# Patient Record
Sex: Female | Born: 1995 | Race: White | Hispanic: No | Marital: Single | Smoking: Never smoker
Health system: Southern US, Community
[De-identification: ages and names within clinical notes are randomized; demographics above are authoritative.]

## PROBLEM LIST (undated history)

## (undated) DIAGNOSIS — G039 Meningitis, unspecified: Secondary | ICD-10-CM

## (undated) HISTORY — PX: WRIST SURGERY: SHX841

---
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
## 2022-07-11 ENCOUNTER — Emergency Department (HOSPITAL_COMMUNITY)
Admission: EM | Admit: 2022-07-11 | Discharge: 2022-07-11 | Disposition: A | Discharge status: Home / Self Care | Payer: Self-pay | Attending: Emergency Medicine | Admitting: Emergency Medicine

## 2022-07-11 ENCOUNTER — Other Ambulatory Visit: Payer: Self-pay

## 2022-07-11 ENCOUNTER — Encounter (HOSPITAL_COMMUNITY): Payer: Self-pay | Admitting: Emergency Medicine

## 2022-07-11 DIAGNOSIS — L02415 Cutaneous abscess of right lower limb: Secondary | ICD-10-CM | POA: Insufficient documentation

## 2022-07-11 MED ORDER — DOXYCYCLINE HYCLATE 100 MG PO CAPS: 100.0000 mg | ORAL_CAPSULE | Freq: Two times a day (BID) | ORAL | 0 refills | Status: DC

## 2022-07-11 NOTE — ED Provider Notes (Signed)
[name]   CSN: [id number] Arrival date & time: 07/11/22  1253     History  Chief Complaint  Patient presents with   Insect Bite    Mackenzie Strickland is a 26 y.o. female.  Patient presents to the emergency department today for evaluation of abscess and cellulitis to the inner right leg.  She denies trauma and thinks that she may have been bitten by an insect, but is not sure.  No fevers, nausea or vomiting.  She has a history of cellulitis in her legs.  She has been applying warm compresses and doing soaks at home.  She and her family member squeezed the abscess yesterday and drained dark-colored fluid from the area.       Home Medications Prior to Admission medications   Medication Sig Start Date End Date Taking? Authorizing Provider  doxycycline (VIBRAMYCIN) 100 MG capsule Take 1 capsule (100 mg total) by mouth 2 (two) times daily. 07/11/22  Yes Renne Crigler, PA-C      Allergies    Azithromycin    Review of Systems   Review of Systems  Physical Exam Updated Vital Signs BP 119/81 (BP Location: Right Arm)   Pulse 97   Temp 98.3 F (36.8 C) (Oral)   Resp 16   Ht 5' 7.5" (1.715 m)   Wt 115.2 kg   LMP 06/21/2022   SpO2 100%   BMI 39.19 kg/m  Physical Exam Vitals and nursing note reviewed.  Constitutional:      Appearance: She is well-developed.  HENT:     Head: Normocephalic and atraumatic.  Eyes:     Conjunctiva/sclera: Conjunctivae normal.  Pulmonary:     Effort: No respiratory distress.  Musculoskeletal:     Cervical back: Normal range of motion and neck supple.  Skin:    General: Skin is warm and dry.     Comments: There is approximately 2 cm diameter area of induration to the inner aspect of the right thigh.  There is several centimeters of surrounding cellulitis.  No lymphangitis.  The area is not particularly fluctuant today.  Mild tenderness.  Neurological:     Mental Status: She is alert.     ED Results /  Procedures / Treatments   Labs (all labs ordered are listed, but only abnormal results are displayed) Labs Reviewed - No data to display  EKG None  Radiology No results found.  Procedures Procedures    Medications Ordered in ED Medications - No data to display  ED Course/ Medical Decision Making/ A&P    Patient seen and examined. History obtained directly from patient.   Labs/EKG: None ordered  Imaging: None ordered  Medications/Fluids: None ordered  Most recent vital signs reviewed and are as follows: BP 119/81 (BP Location: Right Arm)   Pulse 97   Temp 98.3 F (36.8 C) (Oral)   Resp 16   Ht 5' 7.5" (1.715 m)   Wt 115.2 kg   LMP 06/21/2022   SpO2 100%   BMI 39.19 kg/m   Initial impression: Skin abscess and cellulitis.  We discussed incision and drainage.  Discussed risks and benefits of doing a procedure today versus monitoring at home.  Discussed need for follow-up if symptoms persist or worsen on antibiotics alone.  Patient would prefer not to have incision and drainage as she was able to express fluid from it yesterday.  Home treatment plan: Continue warm soaks compresses, p.o. doxycycline  Return instructions discussed with patient: Return  in 48 hours if not improving or sooner with worsening.                          Medical Decision Making Risk Prescription drug management.   Patient with uncomplicated cutaneous abscess with some associated cellulitis.  No fevers or signs of sepsis today.  No sign of skin breakdown or necrosis.  Area is indurated but not fluctuant.  Patient declines I&D today.        Final Clinical Impression(s) / ED Diagnoses Final diagnoses:  Cutaneous abscess of right lower extremity    Rx / DC Orders ED Discharge Orders          Ordered    doxycycline (VIBRAMYCIN) 100 MG capsule  2 times daily        07/11/22 1333              Renne Crigler, PA-C 07/11/22 1337    Gloris Manchester, MD 07/11/22 1806

## 2022-07-11 NOTE — ED Triage Notes (Signed)
Pt presents with insect bite to right inner thigh, possibly spider bite.

## 2022-07-11 NOTE — Discharge Instructions (Signed)
Please read and follow all provided instructions.  Your diagnoses today include:  1. Cutaneous abscess of right lower extremity     Tests performed today include: Vital signs. See below for your results today.   Medications prescribed:  Doxycycline - antibiotic  You have been prescribed an antibiotic medicine: take the entire course of medicine even if you are feeling better. Stopping early can cause the antibiotic not to work.  Take any prescribed medications only as directed.   Home care instructions:  Follow any educational materials contained in this packet  Follow-up instructions: Return to the Emergency Department in 48 hours for a recheck if your symptoms are not significantly improved.  Please follow-up with your primary care provider in the next 1 week for further evaluation of your symptoms.   Return instructions:  Return to the Emergency Department if you have: Fever Worsening symptoms Worsening pain Worsening swelling Redness of the skin that moves away from the affected area, especially if it streaks away from the affected area  Any other emergent concerns  Your vital signs today were: BP 119/81 (BP Location: Right Arm)   Pulse 97   Temp 98.3 F (36.8 C) (Oral)   Resp 16   Ht 5' 7.5" (1.715 m)   Wt 115.2 kg   LMP 06/21/2022   SpO2 100%   BMI 39.19 kg/m  If your blood pressure (BP) was elevated above 135/85 this visit, please have this repeated by your doctor within one month. --------------

## 2022-08-26 ENCOUNTER — Emergency Department (HOSPITAL_COMMUNITY): Payer: Self-pay

## 2022-08-26 ENCOUNTER — Encounter (HOSPITAL_COMMUNITY): Payer: Self-pay | Admitting: Emergency Medicine

## 2022-08-26 ENCOUNTER — Other Ambulatory Visit: Payer: Self-pay

## 2022-08-26 ENCOUNTER — Emergency Department (HOSPITAL_COMMUNITY)
Admission: EM | Admit: 2022-08-26 | Discharge: 2022-08-26 | Disposition: A | Discharge status: Home / Self Care | Payer: Self-pay | Attending: Student | Admitting: Student

## 2022-08-26 DIAGNOSIS — M549 Dorsalgia, unspecified: Secondary | ICD-10-CM | POA: Insufficient documentation

## 2022-08-26 DIAGNOSIS — E876 Hypokalemia: Secondary | ICD-10-CM | POA: Insufficient documentation

## 2022-08-26 DIAGNOSIS — R519 Headache, unspecified: Secondary | ICD-10-CM | POA: Insufficient documentation

## 2022-08-26 DIAGNOSIS — Z20822 Contact with and (suspected) exposure to covid-19: Secondary | ICD-10-CM | POA: Insufficient documentation

## 2022-08-26 DIAGNOSIS — R109 Unspecified abdominal pain: Secondary | ICD-10-CM | POA: Insufficient documentation

## 2022-08-26 DIAGNOSIS — M791 Myalgia, unspecified site: Secondary | ICD-10-CM | POA: Insufficient documentation

## 2022-08-26 HISTORY — DX: Meningitis, unspecified: G03.9

## 2022-08-26 LAB — URINALYSIS, ROUTINE W REFLEX MICROSCOPIC
Bilirubin Urine: NEGATIVE
Glucose, UA: NEGATIVE mg/dL
Ketones, ur: NEGATIVE mg/dL
Nitrite: NEGATIVE
Protein, ur: NEGATIVE mg/dL
RBC / HPF: >50 RBC/hpf — ABNORMAL HIGH (ref 0–5)
Specific Gravity, Urine: 1.005 (ref 1.005–1.030)
WBC, UA: >50 WBC/hpf — ABNORMAL HIGH (ref 0–5)
pH: 7 (ref 5.0–8.0)

## 2022-08-26 LAB — I-STAT BETA HCG BLOOD, ED (MC, WL, AP ONLY): I-stat hCG, quantitative: <5 m[IU]/mL (ref <5)

## 2022-08-26 LAB — CBC WITH DIFFERENTIAL/PLATELET
Abs Immature Granulocytes: 0.03 10*3/uL (ref 0.00–0.07)
Basophils Absolute: 0 10*3/uL (ref 0.0–0.1)
Basophils Relative: 0 %
Eosinophils Absolute: 0.1 10*3/uL (ref 0.0–0.5)
Eosinophils Relative: 1 %
HCT: 40.8 % (ref 36.0–46.0)
Hemoglobin: 13.5 g/dL (ref 12.0–15.0)
Immature Granulocytes: 0 %
Lymphocytes Relative: 19 %
Lymphs Abs: 2 10*3/uL (ref 0.7–4.0)
MCH: 27.8 pg (ref 26.0–34.0)
MCHC: 33.1 g/dL (ref 30.0–36.0)
MCV: 84 fL (ref 80.0–100.0)
Monocytes Absolute: 0.5 10*3/uL (ref 0.1–1.0)
Monocytes Relative: 5 %
Neutro Abs: 7.5 10*3/uL (ref 1.7–7.7)
Neutrophils Relative %: 75 %
Platelets: 305 10*3/uL (ref 150–400)
RBC: 4.86 MIL/uL (ref 3.87–5.11)
RDW: 13.4 % (ref 11.5–15.5)
WBC: 10.2 10*3/uL (ref 4.0–10.5)
nRBC: 0 % (ref 0.0–0.2)

## 2022-08-26 LAB — COMPREHENSIVE METABOLIC PANEL
ALT: 14 U/L (ref 0–44)
AST: 22 U/L (ref 15–41)
Albumin: 4.2 g/dL (ref 3.5–5.0)
Alkaline Phosphatase: 97 U/L (ref 38–126)
Anion gap: 4 — ABNORMAL LOW (ref 5–15)
BUN: 10 mg/dL (ref 6–20)
CO2: 22 mmol/L (ref 22–32)
Calcium: 9.2 mg/dL (ref 8.9–10.3)
Chloride: 112 mmol/L — ABNORMAL HIGH (ref 98–111)
Creatinine, Ser: 0.8 mg/dL (ref 0.44–1.00)
GFR, Estimated: >60 mL/min (ref >60)
Glucose, Bld: 96 mg/dL (ref 70–99)
Potassium: 3.3 mmol/L — ABNORMAL LOW (ref 3.5–5.1)
Sodium: 138 mmol/L (ref 135–145)
Total Bilirubin: 0.3 mg/dL (ref 0.3–1.2)
Total Protein: 8.2 g/dL — ABNORMAL HIGH (ref 6.5–8.1)

## 2022-08-26 LAB — RESP PANEL BY RT-PCR (FLU A&B, COVID) ARPGX2
Influenza A by PCR: NEGATIVE
Influenza B by PCR: NEGATIVE
SARS Coronavirus 2 by RT PCR: NEGATIVE

## 2022-08-26 MED ORDER — PROMETHAZINE HCL 12.5 MG PO TABS: 12.5000 mg | ORAL_TABLET | Freq: Three times a day (TID) | ORAL | 0 refills | Status: DC | PRN

## 2022-08-26 MED ORDER — LIDOCAINE 5 % EX PTCH
1.0000 | MEDICATED_PATCH | CUTANEOUS | Status: DC
  Administered 2022-08-26: 1 via TRANSDERMAL
  Filled 2022-08-26: qty 1

## 2022-08-26 MED ORDER — PROCHLORPERAZINE EDISYLATE 10 MG/2ML IJ SOLN
10.0000 mg | Freq: Once | INTRAMUSCULAR | Status: AC
  Administered 2022-08-26: 10 mg via INTRAVENOUS
  Filled 2022-08-26: qty 2

## 2022-08-26 MED ORDER — KETOROLAC TROMETHAMINE 15 MG/ML IJ SOLN
15.0000 mg | Freq: Once | INTRAMUSCULAR | Status: AC
  Administered 2022-08-26: 15 mg via INTRAVENOUS
  Filled 2022-08-26: qty 1

## 2022-08-26 MED ORDER — LACTATED RINGERS IV BOLUS
1000.0000 mL | Freq: Once | INTRAVENOUS | Status: AC
  Administered 2022-08-26: 1000 mL via INTRAVENOUS

## 2022-08-26 MED ORDER — PROMETHAZINE HCL 12.5 MG PO TABS: 12.5000 mg | ORAL_TABLET | Freq: Three times a day (TID) | ORAL | 0 refills | Status: AC | PRN

## 2022-08-26 MED ORDER — DIPHENHYDRAMINE HCL 50 MG/ML IJ SOLN
25.0000 mg | Freq: Once | INTRAMUSCULAR | Status: AC
  Administered 2022-08-26: 25 mg via INTRAVENOUS
  Filled 2022-08-26: qty 1

## 2022-08-26 NOTE — ED Triage Notes (Signed)
Pt states that she is here for a migraine. States she felt this way 5 yrs ago and it "ended up being meningitis".

## 2022-08-26 NOTE — Discharge Instructions (Addendum)
It was a pleasure caring for you today in the emergency department.  Please return to the emergency department for any worsening or worrisome symptoms.  Please follow up with your primary care doctor in the next couple days

## 2022-08-26 NOTE — ED Provider Notes (Addendum)
[facility] EMERGENCY DEPARTMENT Provider Note  CSN: [id number] Arrival date & time: 08/26/22 0351  Chief Complaint(s) Migraine  HPI Mackenzie Strickland is a 26 y.o. female with reported history of meningitis, migraines who presents emergency department for evaluation of a migraine headache.  Patient states that she has had a persistent headache for the last 4 days that has been gradually worsening.  She endorses associated back pain and myalgias but denies fever, neck stiffness, visual changes, numbness, tingling, weakness or other neurologic complaints.  Endorses nausea but no vomiting.  States she is currently on her menstrual cycle.   Past Medical History Past Medical History:  Diagnosis Date   Meningitis    There are no problems to display for this patient.  Home Medication(s) Prior to Admission medications   Medication Sig Start Date End Date Taking? Authorizing Provider  doxycycline (VIBRAMYCIN) 100 MG capsule Take 1 capsule (100 mg total) by mouth 2 (two) times daily. 07/11/22   Renne Crigler, PA-C                                                                                                                                    Past Surgical History Past Surgical History:  Procedure Laterality Date   WRIST SURGERY Left    Family History History reviewed. No pertinent family history.  Social History Social History   Tobacco Use   Smoking status: Never   Smokeless tobacco: Never  Vaping Use   Vaping Use: Never used  Substance Use Topics   Alcohol use: Yes    Comment: ocassionally   Drug use: Not Currently   Allergies Azithromycin  Review of Systems Review of Systems  Musculoskeletal:  Positive for myalgias.  Neurological:  Positive for headaches.    Physical Exam Vital Signs  I have reviewed the triage vital signs BP 120/64 (BP Location: Left Arm)   Pulse 81   Temp 98 F (36.7 C) (Oral)   Resp 20   Ht 5' 7.5" (1.715 m)   Wt 125.2 kg   LMP 08/22/2022  (Exact Date)   SpO2 100%   BMI 42.59 kg/m   Physical Exam Vitals and nursing note reviewed.  Constitutional:      General: She is not in acute distress.    Appearance: She is well-developed.  HENT:     Head: Normocephalic and atraumatic.  Eyes:     Conjunctiva/sclera: Conjunctivae normal.  Cardiovascular:     Rate and Rhythm: Normal rate and regular rhythm.     Heart sounds: No murmur heard. Pulmonary:     Effort: Pulmonary effort is normal. No respiratory distress.     Breath sounds: Normal breath sounds.  Abdominal:     Palpations: Abdomen is soft.     Tenderness: There is no abdominal tenderness.  Musculoskeletal:        General: No swelling.     Cervical back: Neck supple. No rigidity.  Skin:    General: Skin is warm and dry.     Capillary Refill: Capillary refill takes less than 2 seconds.  Neurological:     Mental Status: She is alert.     Cranial Nerves: No cranial nerve deficit.     Sensory: No sensory deficit.     Motor: No weakness.  Psychiatric:        Mood and Affect: Mood normal.     ED Results and Treatments Labs (all labs ordered are listed, but only abnormal results are displayed) Labs Reviewed  RESP PANEL BY RT-PCR (FLU A&B, COVID) ARPGX2  COMPREHENSIVE METABOLIC PANEL  CBC WITH DIFFERENTIAL/PLATELET  I-STAT BETA HCG BLOOD, ED ([facility], [facility], [facility] ONLY)                                                                                                                          Radiology No results found.  Pertinent labs & imaging results that were available during my care of the patient were reviewed by me and considered in my medical decision making (see MDM for details).  Medications Ordered in ED Medications  prochlorperazine (COMPAZINE) injection 10 mg (has no administration in time range)  diphenhydrAMINE (BENADRYL) injection 25 mg (has no administration in time range)  lactated ringers bolus 1,000 mL (has no administration in time range)                                                                                                                                      Procedures Procedures  (including critical care time)  Medical Decision Making / ED Course   This patient presents to the ED for concern of headache, this involves an extensive number of treatment options, and is a complaint that carries with it a high risk of complications and morbidity.  The differential diagnosis includes migraine headache, tension headache, dehydration, meningitis, brain mass  MDM: Patient seen emergency room for evaluation of a headache.  Physical exam largely unremarkable with no focal motor or sensory deficits.  No cranial nerve deficits.  Laboratory evaluation with some mild hypokalemia to 3.3 but is otherwise unremarkable.  Pregnancy test negative.  CT head obtained due to patient concern for strong family history of brain cancer and the scan is reassuringly negative for large intracranial mass.  Patient given headache cocktail with Compazine and Benadryl as well as fluid resuscitation and on  reevaluation the headache has improved but she continues to complain of right flank pain.  She was given Toradol and a lidocaine patch and a CT stone study was ordered.  At time of signout, patient pending follow-up of urinalysis and stone study as well as reevaluation after additional pain control.  Please see provider signout for continuation of work-up.   Additional history obtained: -Additional history obtained from partner -External records from outside source obtained and reviewed including: Chart review including previous notes, labs, imaging, consultation notes   Lab Tests: -I ordered, reviewed, and interpreted labs.   The pertinent results include:   Labs Reviewed  RESP PANEL BY RT-PCR (FLU A&B, COVID) ARPGX2  COMPREHENSIVE METABOLIC PANEL  CBC WITH DIFFERENTIAL/PLATELET  I-STAT BETA HCG BLOOD, ED ([facility], [facility], [facility] ONLY)      Imaging Studies  ordered: I ordered imaging studies including CT head I independently visualized and interpreted imaging. I agree with the radiologist interpretation   Medicines ordered and prescription drug management: Meds ordered this encounter  Medications   prochlorperazine (COMPAZINE) injection 10 mg   diphenhydrAMINE (BENADRYL) injection 25 mg   lactated ringers bolus 1,000 mL    -I have reviewed the patients home medicines and have made adjustments as needed  Critical interventions none    Cardiac Monitoring: The patient was maintained on a cardiac monitor.  I personally viewed and interpreted the cardiac monitored which showed an underlying rhythm of: NSR  Social Determinants of Health:  Factors impacting patients care include: none   Reevaluation: After the interventions noted above, I reevaluated the patient and found that they have :improved  Co morbidities that complicate the patient evaluation  Past Medical History:  Diagnosis Date   Meningitis       Dispostion: I considered admission for this patient, and patient disposition pending completion of work-up.  Please see provider signout for continuation of work-up.     Final Clinical Impression(s) / ED Diagnoses Final diagnoses:  None     @PCDICTATION @    , MD 08/26/22 2309    08/28/22, MD 08/26/22 2309

## 2022-08-26 NOTE — ED Provider Notes (Signed)
  Provider Note MRN:  [id number]  Arrival date & time: 08/26/22    ED Course and Medical Decision Making  Assumed care from [place] at shift change.  See note from prior team for complete details, in brief:  26 yo female  Cc headache Mild improvement in HA after meds, CTH neg Having some flank pain as well, CT renal pending  No meningismus   Plan per prior physician CT, meds  Ct renal reviewed Ua w/ blood, no uti; she is menstruating HA has resolved Feeling much better overall  Patient presents with headache. Based on the patient's history and physical there is very low clinical suspicion for significant intracranial pathology. The headache was not sudden onset, not maximal at onset, there are no neurologic findings on exam, the patient does not have a fever, the patient does not have any jaw claudication, the patient does not endorse a clotting disorder, patient denies any trauma or eye pain and the headache is not associated with dizziness, weakness on one side of the body, diplopia, vertigo, slurred speech, or ataxia. Given the extremely low risk of these diagnoses further testing and evaluation for these possibilities does not appear to be indicated at this time.   The patient improved significantly and was discharged in stable condition. Detailed discussions were had with the patient regarding current findings, and need for close f/u with PCP or on call doctor. The patient has been instructed to return immediately if the symptoms worsen in any way for re-evaluation. Patient verbalized understanding and is in agreement with current care plan. All questions answered prior to discharge.  Procedures  Final Clinical Impressions(s) / ED Diagnoses     ICD-10-CM   1. Bad headache  R51.9     2. Flank pain  R10.9       ED Discharge Orders          Ordered    promethazine (PHENERGAN) 12.5 MG tablet  Every 8 hours PRN        08/26/22 0958              Discharge Instructions       It was a pleasure caring for you today in the emergency department.  Please return to the emergency department for any worsening or worrisome symptoms.  Please follow up with your primary care doctor in the next couple days         Jeanell Sparrow, DO 08/26/22 2993

## 2022-11-19 ENCOUNTER — Other Ambulatory Visit: Payer: Self-pay

## 2022-11-19 ENCOUNTER — Emergency Department (HOSPITAL_COMMUNITY)
Admission: EM | Admit: 2022-11-19 | Discharge: 2022-11-19 | Disposition: A | Discharge status: Home / Self Care | Payer: Medicaid Other | Attending: Emergency Medicine | Admitting: Emergency Medicine

## 2022-11-19 DIAGNOSIS — R1033 Periumbilical pain: Secondary | ICD-10-CM | POA: Diagnosis not present

## 2022-11-19 DIAGNOSIS — O26899 Other specified pregnancy related conditions, unspecified trimester: Secondary | ICD-10-CM | POA: Insufficient documentation

## 2022-11-19 DIAGNOSIS — R1084 Generalized abdominal pain: Secondary | ICD-10-CM | POA: Insufficient documentation

## 2022-11-19 DIAGNOSIS — R1030 Lower abdominal pain, unspecified: Secondary | ICD-10-CM | POA: Insufficient documentation

## 2022-11-19 DIAGNOSIS — Z3490 Encounter for supervision of normal pregnancy, unspecified, unspecified trimester: Secondary | ICD-10-CM

## 2022-11-19 DIAGNOSIS — Z3A00 Weeks of gestation of pregnancy not specified: Secondary | ICD-10-CM | POA: Diagnosis not present

## 2022-11-19 LAB — COMPREHENSIVE METABOLIC PANEL
ALT: 19 U/L (ref 0–44)
AST: 29 U/L (ref 15–41)
Albumin: 4.1 g/dL (ref 3.5–5.0)
Alkaline Phosphatase: 102 U/L (ref 38–126)
Anion gap: 7 (ref 5–15)
BUN: 10 mg/dL (ref 6–20)
CO2: 22 mmol/L (ref 22–32)
Calcium: 9.4 mg/dL (ref 8.9–10.3)
Chloride: 108 mmol/L (ref 98–111)
Creatinine, Ser: 0.67 mg/dL (ref 0.44–1.00)
GFR, Estimated: >60 mL/min (ref >60)
Glucose, Bld: 86 mg/dL (ref 70–99)
Potassium: 3.9 mmol/L (ref 3.5–5.1)
Sodium: 137 mmol/L (ref 135–145)
Total Bilirubin: 0.7 mg/dL (ref 0.3–1.2)
Total Protein: 7.7 g/dL (ref 6.5–8.1)

## 2022-11-19 LAB — HCG, SERUM, QUALITATIVE: Preg, Serum: POSITIVE — AB

## 2022-11-19 LAB — CBC
HCT: 39.1 % (ref 36.0–46.0)
Hemoglobin: 12.9 g/dL (ref 12.0–15.0)
MCH: 28 pg (ref 26.0–34.0)
MCHC: 33 g/dL (ref 30.0–36.0)
MCV: 84.8 fL (ref 80.0–100.0)
Platelets: 350 10*3/uL (ref 150–400)
RBC: 4.61 MIL/uL (ref 3.87–5.11)
RDW: 13.6 % (ref 11.5–15.5)
WBC: 10.5 10*3/uL (ref 4.0–10.5)
nRBC: 0 % (ref 0.0–0.2)

## 2022-11-19 LAB — LIPASE, BLOOD: Lipase: 35 U/L (ref 11–51)

## 2022-11-19 NOTE — ED Triage Notes (Signed)
Pt with abd pain x 3 days, + nausea.  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  Pt requesting a pregnancy test, possibility she is pregnant. Used a Paramedic for pregnancy and pt states it was hard to read.

## 2022-11-19 NOTE — ED Provider Notes (Signed)
[facility] EMERGENCY DEPARTMENT Provider Note   CSN: [id number] Arrival date & time: 11/19/22  1218     History  Chief Complaint  Patient presents with   Abdominal Pain    Mackenzie Strickland is a 26 y.o. female presents to the emergency department complaining of abdominal pain for the past 3 days with some nausea and dry heaving.  Patient states while she was at work yesterday she also felt like she was about to pass out and was dizzy, but states she works in a very warm environment and had been on her feet all day.  Patient also is requesting a pregnancy test with the possibility she is pregnant.  LMP was 10/18/2022 and patient is normally regular.  She states she used a Psychiatric nurse pregnancy test and it was difficult to read.  She describes her abdominal pain as a "stretching" type of pain.  She also reports some increased urinary frequency but denies dysuria or difficulty urinating.  Denies fever, chills, diarrhea, constipation, weakness, syncope, palpitations, chest pain, shortness of breath.       Home Medications Prior to Admission medications   Medication Sig Start Date End Date Taking? Authorizing Provider  doxycycline (VIBRAMYCIN) 100 MG capsule Take 1 capsule (100 mg total) by mouth 2 (two) times daily. 07/11/22   Renne Crigler, PA-C  promethazine (PHENERGAN) 12.5 MG tablet Take 1 tablet (12.5 mg total) by mouth every 8 (eight) hours as needed (headache). 08/26/22   Sloan Leiter, DO      Allergies    Azithromycin    Review of Systems   Review of Systems  Constitutional:  Negative for chills and fever.  Respiratory:  Negative for shortness of breath.   Cardiovascular:  Negative for chest pain and palpitations.  Gastrointestinal:  Positive for abdominal pain and nausea. Negative for constipation, diarrhea and vomiting.  Genitourinary:  Positive for frequency. Negative for difficulty urinating, dysuria, hematuria, pelvic pain, vaginal bleeding and vaginal discharge.   Neurological:  Positive for dizziness. Negative for syncope and weakness.    Physical Exam Updated Vital Signs BP (!) 149/77 (BP Location: Right Arm)   Pulse (!) 104   Temp 97.7 F (36.5 C) (Oral)   Resp 16   Ht 5' 7.5" (1.715 m)   Wt 125.2 kg   SpO2 100%   BMI 42.59 kg/m  Physical Exam Vitals and nursing note reviewed.  Constitutional:      General: She is not in acute distress.    Appearance: She is obese. She is not ill-appearing.  HENT:     Mouth/Throat:     Mouth: Mucous membranes are moist.     Pharynx: Oropharynx is clear.  Cardiovascular:     Rate and Rhythm: Normal rate and regular rhythm.     Pulses: Normal pulses.     Heart sounds: Normal heart sounds.  Pulmonary:     Effort: Pulmonary effort is normal. No respiratory distress.     Breath sounds: Normal breath sounds and air entry.  Abdominal:     General: Abdomen is flat. Bowel sounds are normal. There is no distension.     Palpations: Abdomen is soft.     Tenderness: There is abdominal tenderness in the periumbilical area and suprapubic area.  Skin:    General: Skin is warm and dry.     Capillary Refill: Capillary refill takes less than 2 seconds.  Neurological:     Mental Status: She is alert. Mental status is at baseline.  Psychiatric:  Mood and Affect: Mood normal.        Behavior: Behavior normal.     ED Results / Procedures / Treatments   Labs (all labs ordered are listed, but only abnormal results are displayed) Labs Reviewed  HCG, SERUM, QUALITATIVE - Abnormal; Notable for the following components:      Result Value   Preg, Serum POSITIVE (*)    All other components within normal limits  LIPASE, BLOOD  COMPREHENSIVE METABOLIC PANEL  CBC    EKG EKG Interpretation  Date/Time:  Sunday November 19 2022 12:36:57 EST Ventricular Rate:  100 PR Interval:  170 QRS Duration: 98 QT Interval:  374 QTC Calculation: 482 R Axis:   65 Text Interpretation: Normal sinus rhythm Low voltage  QRS Cannot rule out Anterior infarct , age undetermined Abnormal ECG No previous ECGs available Confirmed by Sherwood Gambler [phone]) on 11/19/2022 2:21:46 PM  Radiology No results found.  Procedures Procedures    Medications Ordered in ED Medications - No data to display  ED Course/ Medical Decision Making/ A&P                           Medical Decision Making Amount and/or Complexity of Data Reviewed Labs: ordered.   This patient presents to the ED with chief complaint(s) of abdominal pain, nausea, near syncope with menstrual cycle being approximately 5 days late.  Patient has concern for pregnancy.  She reports she works at [employer] and is a very hot environment which may have contributed to her near syncopal episode.  The differential diagnosis includes pregnancy, gastroenteritis, gastritis, pancreatitis, electrolyte disturbance, biliary colic, gastroparesis, symptoms secondary to viral etiology.  The initial plan is to obtain baseline labs including CMP, CBC, lipase, and pregnancy testing.  Will also obtain UA to rule out UTI.  Initial Assessment:   On exam, patient is sitting upright in a chair and appears to be comfortable and in no acute distress.  Abdomen is soft and tenderness appreciated in the suprapubic and periumbilical region.  No palpable masses or hernias.  No rashes.  Lungs clear to auscultation bilaterally.  Heart rate is normal, regular rhythm.  Skin is warm and dry.  Independent ECG/labs interpretation:  The following labs were independently interpreted:  ECG demonstrates sinus rhythm with flattened T waves in anterior leads. CBC reveals no evidence of leukocytosis, anemia, or abnormal platelet count. Metabolic panel shows no significant electrolyte disturbances.  Kidney function is normal. Lipase is unremarkable. Pregnancy test is positive.  Independent visualization and interpretation of imaging: I do not feel that based on patient presentation and  physical exam findings that imaging is warranted at this time.  Treatment and Reassessment: Patient appears relatively comfortable and I do not feel that medications or other intervention is required at this time.  Patient has no active vomiting and states her abdominal pain is not severe.  Patient mainly concerned about possibility of pregnancy.  Patient is still unable to provide a urine sample.  She only reports mild increased frequency, no dysuria, no hematuria, or other urinary symptoms.  I feel that it is appropriate to defer any UA at this time and will have patient follow-up closely with OB.  Disposition:   After consideration of the diagnostic results and the patients response to treatment, I feel that emergency department workup does not suggest an emergent condition requiring admission or immediate intervention beyond what has been performed at this time.  The patient is safe for  discharge and has been instructed to return immediately for worsening symptoms, change in symptoms or any other concerns.  Patient is abdominal pain and nausea may be secondary to positive pregnancy.  Discussed starting prenatal vitamins and establishing OB care as soon as possible.  Patient expressed her verbal understanding and states her sister-in-law has already recommended to her a OB for follow-up.           Final Clinical Impression(s) / ED Diagnoses Final diagnoses:  Pregnancy, unspecified gestational age  Generalized abdominal pain    Rx / DC Orders ED Discharge Orders     None         Pat Kocher, [place] 11/19/22 1603    Sherwood Gambler, MD 11/22/22 [phone]

## 2022-11-19 NOTE — Discharge Instructions (Addendum)
Thank you for allowing me part of your care today.  Your pregnancy test was positive which may be attributing to your symptoms.  Please establish care with an OB as soon as possible as well as start taking daily prenatal vitamins.   Return to the ER if you develop any new or worsening symptoms including severe abdominal pain and/or heavy vaginal bleeding or have any new concerns.

## 2022-11-23 ENCOUNTER — Other Ambulatory Visit: Payer: Self-pay

## 2022-11-23 ENCOUNTER — Emergency Department (HOSPITAL_COMMUNITY): Payer: Medicaid Other

## 2022-11-23 ENCOUNTER — Emergency Department (HOSPITAL_COMMUNITY)
Admission: EM | Admit: 2022-11-23 | Discharge: 2022-11-23 | Disposition: A | Discharge status: Home / Self Care | Payer: Medicaid Other | Attending: Emergency Medicine | Admitting: Emergency Medicine

## 2022-11-23 ENCOUNTER — Encounter (HOSPITAL_COMMUNITY): Payer: Self-pay

## 2022-11-23 DIAGNOSIS — N9489 Other specified conditions associated with female genital organs and menstrual cycle: Secondary | ICD-10-CM | POA: Diagnosis not present

## 2022-11-23 DIAGNOSIS — Z3A01 Less than 8 weeks gestation of pregnancy: Secondary | ICD-10-CM | POA: Insufficient documentation

## 2022-11-23 DIAGNOSIS — O209 Hemorrhage in early pregnancy, unspecified: Secondary | ICD-10-CM | POA: Diagnosis present

## 2022-11-23 DIAGNOSIS — R109 Unspecified abdominal pain: Secondary | ICD-10-CM | POA: Diagnosis not present

## 2022-11-23 DIAGNOSIS — O200 Threatened abortion: Secondary | ICD-10-CM

## 2022-11-23 DIAGNOSIS — O4690 Antepartum hemorrhage, unspecified, unspecified trimester: Secondary | ICD-10-CM

## 2022-11-23 LAB — URINALYSIS, ROUTINE W REFLEX MICROSCOPIC
Bilirubin Urine: NEGATIVE
Glucose, UA: NEGATIVE mg/dL
Ketones, ur: NEGATIVE mg/dL
Nitrite: NEGATIVE
Protein, ur: 30 mg/dL — AB
RBC / HPF: >50 RBC/hpf — ABNORMAL HIGH (ref 0–5)
Specific Gravity, Urine: 1.024 (ref 1.005–1.030)
Trans Epithel, UA: 2
WBC, UA: >50 WBC/hpf — ABNORMAL HIGH (ref 0–5)
pH: 6 (ref 5.0–8.0)

## 2022-11-23 LAB — COMPREHENSIVE METABOLIC PANEL
ALT: 14 U/L (ref 0–44)
AST: 22 U/L (ref 15–41)
Albumin: 4 g/dL (ref 3.5–5.0)
Alkaline Phosphatase: 90 U/L (ref 38–126)
Anion gap: 6 (ref 5–15)
BUN: 9 mg/dL (ref 6–20)
CO2: 21 mmol/L — ABNORMAL LOW (ref 22–32)
Calcium: 8.9 mg/dL (ref 8.9–10.3)
Chloride: 107 mmol/L (ref 98–111)
Creatinine, Ser: 0.67 mg/dL (ref 0.44–1.00)
GFR, Estimated: >60 mL/min (ref >60)
Glucose, Bld: 88 mg/dL (ref 70–99)
Potassium: 3.5 mmol/L (ref 3.5–5.1)
Sodium: 134 mmol/L — ABNORMAL LOW (ref 135–145)
Total Bilirubin: 0.4 mg/dL (ref 0.3–1.2)
Total Protein: 7.8 g/dL (ref 6.5–8.1)

## 2022-11-23 LAB — CBC WITH DIFFERENTIAL/PLATELET
Abs Immature Granulocytes: 0.03 10*3/uL (ref 0.00–0.07)
Basophils Absolute: 0 10*3/uL (ref 0.0–0.1)
Basophils Relative: 0 %
Eosinophils Absolute: 0.2 10*3/uL (ref 0.0–0.5)
Eosinophils Relative: 2 %
HCT: 40.1 % (ref 36.0–46.0)
Hemoglobin: 12.8 g/dL (ref 12.0–15.0)
Immature Granulocytes: 0 %
Lymphocytes Relative: 29 %
Lymphs Abs: 3 10*3/uL (ref 0.7–4.0)
MCH: 27.4 pg (ref 26.0–34.0)
MCHC: 31.9 g/dL (ref 30.0–36.0)
MCV: 85.7 fL (ref 80.0–100.0)
Monocytes Absolute: 0.6 10*3/uL (ref 0.1–1.0)
Monocytes Relative: 6 %
Neutro Abs: 6.2 10*3/uL (ref 1.7–7.7)
Neutrophils Relative %: 63 %
Platelets: 328 10*3/uL (ref 150–400)
RBC: 4.68 MIL/uL (ref 3.87–5.11)
RDW: 13.7 % (ref 11.5–15.5)
WBC: 10.1 10*3/uL (ref 4.0–10.5)
nRBC: 0 % (ref 0.0–0.2)

## 2022-11-23 LAB — HCG, QUANTITATIVE, PREGNANCY: hCG, Beta Chain, Quant, S: 3593 m[IU]/mL — ABNORMAL HIGH (ref <5)

## 2022-11-23 LAB — ABO/RH: ABO/RH(D): A POS

## 2022-11-23 NOTE — ED Triage Notes (Signed)
Pt presents with abdominal cramping and bleeding that started when she went to bathroom this morning. Pt is approx [redacted] weeks pregnant and is worried that she is miscarrying.

## 2022-11-23 NOTE — ED Notes (Signed)
Pt states she cannot urinate at this time.

## 2022-11-23 NOTE — ED Provider Notes (Signed)
  [facility] EMERGENCY DEPARTMENT Provider Note   CSN: [id number] Arrival date & time: 11/23/22  0500     History  Chief Complaint  Patient presents with   Abdominal Cramping   Threatened Miscarriage    Mackenzie Strickland is a 26 y.o. female.  26 year old female who had an episode of vaginal bleeding earlier today.  Is approximately [redacted] weeks pregnant by last menstrual period.  Has not had any ultrasound to confirm location.  Had another episode of just spotting afterwards and no further vaginal bleeding at this time.  No urinary symptoms.  Has a little bit of abdominal cramping.   Abdominal Cramping       Home Medications Prior to Admission medications   Medication Sig Start Date End Date Taking? Authorizing Provider  doxycycline (VIBRAMYCIN) 100 MG capsule Take 1 capsule (100 mg total) by mouth 2 (two) times daily. 07/11/22   Renne Crigler, PA-C  promethazine (PHENERGAN) 12.5 MG tablet Take 1 tablet (12.5 mg total) by mouth every 8 (eight) hours as needed (headache). 08/26/22   Sloan Leiter, DO      Allergies    Azithromycin    Review of Systems   Review of Systems  Physical Exam Updated Vital Signs BP 131/74   Pulse 82   Temp 98.4 F (36.9 C)   Resp 18   Ht 5\' 7"  (1.702 m)   Wt 122.5 kg   SpO2 100%   BMI 42.29 kg/m  Physical Exam Vitals and nursing note reviewed.  Constitutional:      Appearance: She is well-developed.  HENT:     Head: Normocephalic and atraumatic.  Eyes:     Pupils: Pupils are equal, round, and reactive to light.  Cardiovascular:     Rate and Rhythm: Normal rate and regular rhythm.  Pulmonary:     Effort: No respiratory distress.     Breath sounds: No stridor.  Abdominal:     General: Abdomen is flat. There is no distension.  Musculoskeletal:     Cervical back: Normal range of motion.  Skin:    General: Skin is warm and dry.  Neurological:     General: No focal deficit present.     Mental Status: She is alert.     ED  Results / Procedures / Treatments   Labs (all labs ordered are listed, but only abnormal results are displayed) Labs Reviewed  HCG, QUANTITATIVE, PREGNANCY  CBC WITH DIFFERENTIAL/PLATELET  COMPREHENSIVE METABOLIC PANEL  URINALYSIS, ROUTINE W REFLEX MICROSCOPIC    EKG None  Radiology No results found.  Procedures Procedures    Medications Ordered in ED Medications - No data to display  ED Course/ Medical Decision Making/ A&P                           Medical Decision Making Amount and/or Complexity of Data Reviewed Labs: ordered. Radiology: ordered.  Will check hcg/ua/hb and for location of pregnancy.   Care transferred pending labs/US.   Final Clinical Impression(s) / ED Diagnoses Final diagnoses:  None    Rx / DC Orders ED Discharge Orders     None         Arihant Pennings, [place], MD 11/24/22 [phone]

## 2022-11-23 NOTE — ED Provider Notes (Signed)
  Physical Exam  BP (!) 115/48   Pulse 90   Temp 98 F (36.7 C) (Oral)   Resp 20   Ht 5\' 7"  (1.702 m)   Wt 122.5 kg   SpO2 100%   BMI 42.29 kg/m   Physical Exam Vitals and nursing note reviewed.  Constitutional:      General: She is not in acute distress.    Appearance: She is well-developed.  HENT:     Head: Normocephalic and atraumatic.  Eyes:     Conjunctiva/sclera: Conjunctivae normal.  Cardiovascular:     Rate and Rhythm: Normal rate and regular rhythm.     Heart sounds: No murmur heard. Pulmonary:     Effort: Pulmonary effort is normal. No respiratory distress.  Musculoskeletal:        General: No swelling.     Cervical back: Neck supple.  Skin:    General: Skin is warm and dry.     Capillary Refill: Capillary refill takes less than 2 seconds.  Neurological:     Mental Status: She is alert.  Psychiatric:        Mood and Affect: Mood normal.     Procedures  Procedures  ED Course / MDM    Medical Decision Making Amount and/or Complexity of Data Reviewed Labs: ordered. Radiology: ordered.   Patient received an handoff.  Suspected miscarriage.  Pending TVUS at time of signout.  Transvaginal ultrasound showing no products in the uterus and patient presentation consistent with pregnancy of unknown location.  Beta quant 3593.  I spoke with OB/GYN providers at [facility] in [place] with [facility] of which the patient has a follow-up appointment and they are closed at the 48-hour mark from today and thus we use shared decision making that the patient will present on Monday 1218 for repeat beta quant and if the patient has any worsening bleeding or abdominal pain she will go straight to the MAU at [facility] for further evaluation or come back to the emergency department here at [facility].  Patient then discharged with OB/GYN follow-up.       [facility], MD 11/23/22 1740

## 2023-03-02 ENCOUNTER — Encounter (HOSPITAL_COMMUNITY): Payer: Self-pay

## 2023-03-02 ENCOUNTER — Emergency Department (HOSPITAL_COMMUNITY)
Admission: EM | Admit: 2023-03-02 | Discharge: 2023-03-02 | Disposition: A | Discharge status: Home / Self Care | Payer: Medicaid Other | Attending: Emergency Medicine | Admitting: Emergency Medicine

## 2023-03-02 ENCOUNTER — Other Ambulatory Visit: Payer: Self-pay

## 2023-03-02 DIAGNOSIS — Z3A19 19 weeks gestation of pregnancy: Secondary | ICD-10-CM | POA: Insufficient documentation

## 2023-03-02 DIAGNOSIS — O99612 Diseases of the digestive system complicating pregnancy, second trimester: Secondary | ICD-10-CM | POA: Diagnosis not present

## 2023-03-02 DIAGNOSIS — K219 Gastro-esophageal reflux disease without esophagitis: Secondary | ICD-10-CM | POA: Insufficient documentation

## 2023-03-02 DIAGNOSIS — O99112 Other diseases of the blood and blood-forming organs and certain disorders involving the immune mechanism complicating pregnancy, second trimester: Secondary | ICD-10-CM | POA: Insufficient documentation

## 2023-03-02 DIAGNOSIS — O26892 Other specified pregnancy related conditions, second trimester: Secondary | ICD-10-CM | POA: Diagnosis present

## 2023-03-02 DIAGNOSIS — D72829 Elevated white blood cell count, unspecified: Secondary | ICD-10-CM | POA: Insufficient documentation

## 2023-03-02 DIAGNOSIS — E871 Hypo-osmolality and hyponatremia: Secondary | ICD-10-CM | POA: Diagnosis not present

## 2023-03-02 DIAGNOSIS — Z3492 Encounter for supervision of normal pregnancy, unspecified, second trimester: Secondary | ICD-10-CM

## 2023-03-02 DIAGNOSIS — O99012 Anemia complicating pregnancy, second trimester: Secondary | ICD-10-CM | POA: Insufficient documentation

## 2023-03-02 DIAGNOSIS — O99282 Endocrine, nutritional and metabolic diseases complicating pregnancy, second trimester: Secondary | ICD-10-CM | POA: Insufficient documentation

## 2023-03-02 LAB — BASIC METABOLIC PANEL
Anion gap: 7 (ref 5–15)
BUN: 5 mg/dL — ABNORMAL LOW (ref 6–20)
CO2: 21 mmol/L — ABNORMAL LOW (ref 22–32)
Calcium: 8.5 mg/dL — ABNORMAL LOW (ref 8.9–10.3)
Chloride: 105 mmol/L (ref 98–111)
Creatinine, Ser: 0.45 mg/dL (ref 0.44–1.00)
GFR, Estimated: >60 mL/min (ref >60)
Glucose, Bld: 85 mg/dL (ref 70–99)
Potassium: 3.5 mmol/L (ref 3.5–5.1)
Sodium: 133 mmol/L — ABNORMAL LOW (ref 135–145)

## 2023-03-02 LAB — CBC WITH DIFFERENTIAL/PLATELET
Abs Immature Granulocytes: 0.09 10*3/uL — ABNORMAL HIGH (ref 0.00–0.07)
Basophils Absolute: 0 10*3/uL (ref 0.0–0.1)
Basophils Relative: 0 %
Eosinophils Absolute: 0.3 10*3/uL (ref 0.0–0.5)
Eosinophils Relative: 2 %
HCT: 34.3 % — ABNORMAL LOW (ref 36.0–46.0)
Hemoglobin: 11.4 g/dL — ABNORMAL LOW (ref 12.0–15.0)
Immature Granulocytes: 1 %
Lymphocytes Relative: 23 %
Lymphs Abs: 3 10*3/uL (ref 0.7–4.0)
MCH: 28.2 pg (ref 26.0–34.0)
MCHC: 33.2 g/dL (ref 30.0–36.0)
MCV: 84.9 fL (ref 80.0–100.0)
Monocytes Absolute: 0.7 10*3/uL (ref 0.1–1.0)
Monocytes Relative: 6 %
Neutro Abs: 8.6 10*3/uL — ABNORMAL HIGH (ref 1.7–7.7)
Neutrophils Relative %: 68 %
Platelets: 279 10*3/uL (ref 150–400)
RBC: 4.04 MIL/uL (ref 3.87–5.11)
RDW: 13.9 % (ref 11.5–15.5)
WBC: 12.8 10*3/uL — ABNORMAL HIGH (ref 4.0–10.5)
nRBC: 0 % (ref 0.0–0.2)

## 2023-03-02 LAB — D-DIMER, QUANTITATIVE: D-Dimer, Quant: 0.39 ug/mL-FEU (ref 0.00–0.50)

## 2023-03-02 MED ORDER — FAMOTIDINE 20 MG PO TABS: 20.0000 mg | ORAL_TABLET | Freq: Two times a day (BID) | ORAL | 0 refills | Status: AC

## 2023-03-02 MED ORDER — ALUM & MAG HYDROXIDE-SIMETH 200-200-20 MG/5ML PO SUSP
30.0000 mL | Freq: Once | ORAL | Status: AC
  Administered 2023-03-02: 30 mL via ORAL
  Filled 2023-03-02: qty 30

## 2023-03-02 MED ORDER — FAMOTIDINE 20 MG PO TABS
20.0000 mg | ORAL_TABLET | Freq: Once | ORAL | Status: AC
  Administered 2023-03-02: 20 mg via ORAL
  Filled 2023-03-02: qty 1

## 2023-03-02 NOTE — ED Notes (Signed)
Patient Alert and oriented to baseline. Stable and ambulatory to baseline. Patient verbalized understanding of the discharge instructions.  Patient belongings were taken by the patient.   

## 2023-03-02 NOTE — ED Triage Notes (Signed)
Pt arrived from home via POV c/o painful inspirations. Pt pointed to epigastric area wrapping around to right side of back 6/10 on pain scale. Pt stated that earlier tonight she was moving in bed and her back went out on her

## 2023-03-02 NOTE — ED Provider Notes (Signed)
[facility] Provider Note   CSN: [id number] Arrival date & time: 03/02/23  0353     History  Chief Complaint  Patient presents with   Shortness of Breath    Mackenzie Strickland is a 27 y.o. female.  The history is provided by the patient.  Shortness of Breath She is [redacted] weeks pregnant with her first pregnancy and comes in complaining of sharp epigastric pain tonight.  Pain is worse when she takes a deep breath.  She has been having problems with indigestion during pregnancy and has been taking Rolaids which give temporary relief.  She denies any cough and denies dyspnea denies nausea or vomiting.  Pregnancy has been otherwise uncomplicated.   Home Medications Prior to Admission medications   Medication Sig Start Date End Date Taking? Authorizing Provider  doxycycline (VIBRAMYCIN) 100 MG capsule Take 1 capsule (100 mg total) by mouth 2 (two) times daily. Patient not taking: Reported on 11/23/2022 07/11/22   Carlisle Cater, PA-C  promethazine (PHENERGAN) 12.5 MG tablet Take 1 tablet (12.5 mg total) by mouth every 8 (eight) hours as needed (headache). Patient not taking: Reported on 11/23/2022 08/26/22   Wynona Dove A, DO      Allergies    Azithromycin    Review of Systems   Review of Systems  Respiratory:  Positive for shortness of breath.   All other systems reviewed and are negative.   Physical Exam Updated Vital Signs BP 125/71   Pulse 86   Temp 97.9 F (36.6 C)   Resp 14   LMP 08/22/2022 (Exact Date)   SpO2 98%  Physical Exam Vitals and nursing note reviewed.   27 year old female, resting comfortably and in no acute distress. Vital signs are normal. Oxygen saturation is 98%, which is normal. Head is normocephalic and atraumatic. PERRLA, EOMI. Oropharynx is clear. Neck is nontender and supple without adenopathy. Back is nontender and there is no CVA tenderness. Lungs are clear without rales, wheezes, or rhonchi. Chest is  nontender. Heart has regular rate and rhythm without murmur. Abdomen is soft, flat, with moderate epigastric tenderness.  There is no rebound or guarding.  Gravid uterus is present with size consistent with dates. Extremities have no cyanosis or edema, full range of motion is present. Skin is warm and dry without rash. Neurologic: Mental status is normal, cranial nerves are intact, moves all extremities equally.  ED Results / Procedures / Treatments   Labs (all labs ordered are listed, but only abnormal results are displayed) Labs Reviewed  BASIC METABOLIC PANEL - Abnormal; Notable for the following components:      Result Value   Sodium 133 (*)    CO2 21 (*)    BUN 5 (*)    Calcium 8.5 (*)    All other components within normal limits  CBC WITH DIFFERENTIAL/PLATELET - Abnormal; Notable for the following components:   WBC 12.8 (*)    Hemoglobin 11.4 (*)    HCT 34.3 (*)    Neutro Abs 8.6 (*)    Abs Immature Granulocytes 0.09 (*)    All other components within normal limits  D-DIMER, QUANTITATIVE    EKG EKG Interpretation  Date/Time:  Friday March 02 2023 06:20:39 EDT Ventricular Rate:  78 PR Interval:  195 QRS Duration: 108 QT Interval:  399 QTC Calculation: 455 R Axis:   42 Text Interpretation: Sinus rhythm Low voltage, precordial leads RSR' in V1 or V2, right VCD or RVH Nonspecific  T abnormalities, anterior leads When compared with ECG of 11/19/2022, No significant change was found Confirmed by Delora Fuel ([id number]) on 03/02/2023 7:05:01 AM  Radiology No results found.  Procedures Procedures    Medications Ordered in ED Medications  famotidine (PEPCID) tablet 20 mg (has no administration in time range)  alum & mag hydroxide-simeth (MAALOX/MYLANTA) 200-200-20 MG/5ML suspension 30 mL (30 mLs Oral Given 03/02/23 [id number])    ED Course/ Medical Decision Making/ A&P                             Medical Decision Making Amount and/or Complexity of Data Reviewed Labs:  ordered.  Risk OTC drugs.   Epigastric pain and pregnant patient.  This most likely is GERD, but pleuritic nature of pain is concerning for pulmonary embolism.  I have ordered a dose of Maalox and I have ordered screening labs of CBC, basic metabolic panel, D-dimer.  I have also ordered an electrocardiogram.  I have reviewed her past records, and she is being followed as an outpatient for routine prenatal care with most recent prenatal visit on 02/07/2023.  OB ultrasound on 12/13/2022 confirmed single anterior and pregnancy but was not able to accurately assess gestational age at that point.  I have reviewed and interpreted her laboratory test and my interpretation is mild hyponatremia which is not felt to be clinically significant, mild leukocytosis consistent with second trimester pregnancy, mild anemia which is also consistent with second trimester pregnancy.  D-dimer is normal, pulmonary embolism is excluded.  She had significant relief with a dose of Maalox.  I have ordered a dose of famotidine and I am discharging her with a prescription for famotidine.  She is to continue routine prenatal care.  Final Clinical Impression(s) / ED Diagnoses Final diagnoses:  Gastroesophageal reflux disease, unspecified whether esophagitis present  Second trimester pregnancy  Anemia affecting pregnancy in second trimester    Rx / DC Orders ED Discharge Orders          Ordered    famotidine (PEPCID) 20 MG tablet  2 times daily        03/02/23 [id number]              Delora Fuel, MD [id number] [phone]

## 2023-03-02 NOTE — Discharge Instructions (Addendum)
Take antacids as needed.
